# Patient Record
Sex: Male | Born: 1970 | Race: White | Hispanic: No | Marital: Single | Smoking: Never smoker
Health system: Southern US, Community
[De-identification: ages and names within clinical notes are randomized; demographics above are authoritative.]

## PROBLEM LIST (undated history)

## (undated) HISTORY — PX: WRIST SURGERY: SHX841

---

## 1998-05-19 ENCOUNTER — Encounter: Payer: Self-pay | Admitting: Emergency Medicine

## 1998-05-19 ENCOUNTER — Emergency Department (HOSPITAL_COMMUNITY): Admission: EM | Admit: 1998-05-19 | Discharge: 1998-05-19 | Payer: Self-pay | Admitting: Emergency Medicine

## 2015-11-30 ENCOUNTER — Encounter (HOSPITAL_COMMUNITY): Payer: Self-pay | Admitting: Neurology

## 2015-11-30 ENCOUNTER — Emergency Department (HOSPITAL_COMMUNITY)
Admission: EM | Admit: 2015-11-30 | Discharge: 2015-11-30 | Disposition: A | Discharge status: Home / Self Care | Payer: BLUE CROSS/BLUE SHIELD | Attending: Emergency Medicine | Admitting: Emergency Medicine

## 2015-11-30 ENCOUNTER — Emergency Department (HOSPITAL_COMMUNITY): Payer: BLUE CROSS/BLUE SHIELD

## 2015-11-30 DIAGNOSIS — S0993XA Unspecified injury of face, initial encounter: Secondary | ICD-10-CM | POA: Insufficient documentation

## 2015-11-30 DIAGNOSIS — W208XXA Other cause of strike by thrown, projected or falling object, initial encounter: Secondary | ICD-10-CM | POA: Insufficient documentation

## 2015-11-30 DIAGNOSIS — S0990XA Unspecified injury of head, initial encounter: Secondary | ICD-10-CM | POA: Insufficient documentation

## 2015-11-30 DIAGNOSIS — S0031XA Abrasion of nose, initial encounter: Secondary | ICD-10-CM | POA: Diagnosis not present

## 2015-11-30 DIAGNOSIS — Y93H2 Activity, gardening and landscaping: Secondary | ICD-10-CM | POA: Insufficient documentation

## 2015-11-30 DIAGNOSIS — Y9289 Other specified places as the place of occurrence of the external cause: Secondary | ICD-10-CM | POA: Insufficient documentation

## 2015-11-30 DIAGNOSIS — Y998 Other external cause status: Secondary | ICD-10-CM | POA: Diagnosis not present

## 2015-11-30 MED ORDER — CEPHALEXIN 500 MG PO CAPS: 500.0000 mg | ORAL_CAPSULE | Freq: Four times a day (QID) | ORAL | Status: DC

## 2015-11-30 MED ORDER — NAPROXEN 500 MG PO TABS: 500.0000 mg | ORAL_TABLET | Freq: Two times a day (BID) | ORAL | Status: DC

## 2015-11-30 MED ORDER — DIAZEPAM 5 MG PO TABS: 5.0000 mg | ORAL_TABLET | Freq: Two times a day (BID) | ORAL | Status: DC
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
## 2015-11-30 NOTE — Discharge Instructions (Signed)
Mr. Willaim RayasGlennie L Roemer,  Nice meeting you! Please follow-up with your primary care provider. Return to the emergency department if you develop increased pain, swelling, numbness/tingling, loss of consciousness. Feel better soon!  S. Lane HackerNicole Hommer Cunliffe, PA-C
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
## 2019-01-22 ENCOUNTER — Encounter: Payer: Self-pay | Admitting: Family Medicine

## 2019-02-01 ENCOUNTER — Ambulatory Visit (INDEPENDENT_AMBULATORY_CARE_PROVIDER_SITE_OTHER): Payer: BC Managed Care – PPO | Admitting: Family Medicine

## 2019-02-01 ENCOUNTER — Other Ambulatory Visit: Payer: Self-pay

## 2019-02-01 ENCOUNTER — Encounter: Payer: Self-pay | Admitting: Family Medicine

## 2019-02-01 VITALS — BP 118/82 | HR 103 | Temp 97.8°F | Resp 17 | Ht 69.0 in | Wt 211.0 lb

## 2019-02-01 DIAGNOSIS — R42 Dizziness and giddiness: Secondary | ICD-10-CM | POA: Diagnosis not present

## 2019-02-01 DIAGNOSIS — D72829 Elevated white blood cell count, unspecified: Secondary | ICD-10-CM

## 2019-02-01 DIAGNOSIS — H938X2 Other specified disorders of left ear: Secondary | ICD-10-CM

## 2019-02-01 DIAGNOSIS — H9312 Tinnitus, left ear: Secondary | ICD-10-CM

## 2019-02-01 DIAGNOSIS — R739 Hyperglycemia, unspecified: Secondary | ICD-10-CM

## 2019-02-01 NOTE — Progress Notes (Signed)
Subjective:    Patient ID: Victor Shea, male    DOB: [birth year], 48 y.o.   MRN: [id number]  HPI Victor Shea is a 48 y.o. male Presents today for: Chief Complaint  Patient presents with  . New Patient (Initial Visit)    hospital f/u   New patient to me.  Here for follow-up after [facility] [place].  ER records were reviewed from evaluation on July 7 through July 9, and hospital records reviewed after office visit, no additional information.  Summary from hospitalization: Evaluated for unsteady gait/vertigo.  Ultimately discharged on meclizine 10 mg 3 times daily as needed and Medrol Dosepak 4 mg.  Prochlorperazine 10 mg if needed., no restrictions, but was advised if any further dizziness should not drive, specifically should be 24 hours without dizziness before driving.  CT brain noted from July 7.  No acute intracranial hemorrhage, but there was a nodular density along the right vertex.  Small nodular densities up to 4 mm in the anterior parotids bilaterally that were indeterminate.   MRI of brain was obtained July 8.  No restriction diffusion to suggest acute or subacute infarction.  Area of concern at the right vertex on CT appeared to reflect prominent veins.  Mild mucosal thickening in the ethmoids.   Normal chest x-ray. Glucose elevated at 159, normal troponin x3, normal blood pressure at 130/72.  Slight leukocytosis with WBC 14.3, normal hemoglobin, normal platelets.  Neutrophils of 93.8%.   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  Did not eat breakfast taht day - just V8 and coconut water. Drank coffee, but less water than usual.  Hot day.  Changing load in back of trailer,  Felt a little lightheaded. Drank some water. Drove away and when driving up the road about 45-50 miles - looked down and with head movement - delayed effect - felt dizzy. Pulled off road immediately. No syncope, no seizure.  Mouth felt dry.  Tried to stand up  in cab - felt off balance. Vertigo feeling, off balance. vomited 2-3 times. Called EMS.  eval in hospital.   Had some ringing in left ear day prior to sx's. Some left ear soreness. - still there. Sensation of something in left ear.  No discharge.  No Qtips.  Ringing in left ear - 3 episodes prior - resolves in minutes. Ringing has resolved.  Minimal blood in mucus when blew nose day prior to vertigo.  Mom has meniere's disease.   Finished Medrol yesterday. Last used meclizine about 4-5 days ago.   No chest pain.  No focal weakness,  No slurred speech. Feeling much better, but still minimal symptoms with head movement - not quite at 100%. Improved over 1st few days of meds. Walking normally now.   Has been back driving past 4 days. No difficulty with driving.   There are no active problems to display for this patient.  No past medical history on file. Past Surgical History:  Procedure Laterality Date  . WRIST SURGERY     Allergies  Allergen Reactions  . Percocet [Oxycodone-Acetaminophen] Itching   Prior to Admission medications   Not on File   Social History   Socioeconomic History  . Marital status: Single    Spouse name: Not on file  . Number of children: Not on file  . Years of education: Not on file  . Highest education level: Not on file  Occupational History  . Not on file  Social  Needs  . Financial resource strain: Not on file  . Food insecurity    Worry: Not on file    Inability: Not on file  . Transportation needs    Medical: Not on file    Non-medical: Not on file  Tobacco Use  . Smoking status: Never Smoker  . Smokeless tobacco: Never Used  Substance and Sexual Activity  . Alcohol use: Yes  . Drug use: Never  . Sexual activity: Not on file  Lifestyle  . Physical activity    Days per week: Not on file    Minutes per session: Not on file  . Stress: Not on file  Relationships  . Social Herbalist on phone: Not on file    Gets together:  Not on file    Attends religious service: Not on file    Active member of club or organization: Not on file    Attends meetings of clubs or organizations: Not on file    Relationship status: Not on file  . Intimate partner violence    Fear of current or ex partner: Not on file    Emotionally abused: Not on file    Physically abused: Not on file    Forced sexual activity: Not on file  Other Topics Concern  . Not on file  Social History Narrative  . Not on file    Review of Systems Per HPI.     Objective:   Physical Exam Vitals signs reviewed.  Constitutional:      Appearance: He is well-developed.  HENT:     Head: Normocephalic and atraumatic.     Right Ear: Tympanic membrane, ear canal and external ear normal.     Left Ear: Tympanic membrane, ear canal and external ear normal.  Eyes:     Extraocular Movements:     Right eye: Nystagmus (1-2 beats of horizontal nystagmus bilaterally.  Unchanged with supine to sitting.) present. Normal extraocular motion.     Left eye: Nystagmus present. Normal extraocular motion.     Pupils: Pupils are equal, round, and reactive to light.  Neck:     Vascular: No carotid bruit or JVD.  Cardiovascular:     Rate and Rhythm: Normal rate and regular rhythm.     Heart sounds: Normal heart sounds. No murmur.  Pulmonary:     Effort: Pulmonary effort is normal.     Breath sounds: Normal breath sounds. No rales.  Skin:    General: Skin is warm and dry.  Neurological:     General: No focal deficit present.     Mental Status: He is alert. He is disoriented.     Cranial Nerves: No cranial nerve deficit.     Sensory: No sensory deficit.     Motor: No weakness.     Coordination: Coordination normal.     Gait: Gait normal.    Vitals:   02/01/19 1431 02/01/19 1458  BP: (!) 137/92 118/82  Pulse: (!) 103   Resp: 17   Temp: 97.8 F (36.6 C)   SpO2: 96%        Assessment & Plan:   [name] is a 48 y.o. male Vertigo - Plan: POCT  urinalysis dipstick, Ambulatory referral to ENT Ear fullness, left - Plan: Ambulatory referral to ENT Tinnitus of left ear - Plan: Ambulatory referral to ENT  -Suspected peripheral vertigo, now with significant improvement.  Initial concerns on CT brain but follow-up MRI reassuring.  Has had significant improvement  in symptoms, and only very mild symptoms with turning head quickly, not at rest.  -With associated ear fullness, episodic tinnitus differential includes Mnire's, and will refer to ENT  -Meclizine if needed, maintain hydration, driving precautions discussed -  if any symptoms should avoid driving private or commercial vehicle.  Understanding expressed  Hyperglycemia - Plan: Basic metabolic panel, Hemoglobin A1c  -Check A1c, may have slight elevated glucose given recent corticosteroids.  Leukocytosis, unspecified type - Plan: CBC  -Afebrile.  Initial leukocytosis may have been related to vomiting, now may have some leukocytosis with use of corticosteroids.  If still elevated, may need repeat in the next few weeks.  ER/RTC precautions if any acute worsening of symptoms. No orders of the defined types were placed in this encounter.  Patient Instructions   I am glad to hear that your symptoms have improved.  I recommend against any driving if you are experiencing any dizziness or recurrence of the symptoms.  If the symptoms do recur, can restart meclizine as needed.   I will refer you to ear nose and throat to discuss your previous symptoms as well as the sensation in your left ear.  Additionally I will check blood sugar and blood count testing.  As we discussed blood counts may be elevated from the prednisone, so may need to be repeated in the next few weeks.  Blood sugar can also be elevated from prednisone, but three-month blood sugar test should be helpful.  recheck in 2 weeks.   Return to the clinic or go to the nearest emergency room if any of your symptoms worsen or new  symptoms occur.   Vertigo Vertigo is the feeling that you or your surroundings are moving when they are not. This feeling can come and go at any time. Vertigo often goes away on its own. Vertigo can be dangerous if it occurs while you are doing something that could endanger you or others, such as driving or operating machinery. Your health care provider will do tests to determine the cause of your vertigo. Tests will also help your health care provider decide how best to treat your condition. Follow these instructions at home: Eating and drinking      Drink enough fluid to keep your urine pale yellow.  Do not drink alcohol. Activity  Return to your normal activities as told by your health care provider. Ask your health care provider what activities are safe for you.  In the morning, first sit up on the side of the bed. When you feel okay, stand slowly while you hold onto something until you know that your balance is fine.  Move slowly. Avoid sudden body or head movements or certain positions, as told by your health care provider.  If you have trouble walking or keeping your balance, try using a cane for stability. If you feel dizzy or unstable, sit down right away.  Avoid doing any tasks that would cause danger to you or others if vertigo occurs.  Avoid bending down if you feel dizzy. Place items in your home so that they are easy for you to reach without leaning over.  Do not drive or use heavy machinery if you feel dizzy. General instructions  Take over-the-counter and prescription medicines only as told by your health care provider.  Keep all follow-up visits as told by your health care provider. This is important. Contact a health care provider if:  Your medicines do not relieve your vertigo or they make it worse.  You have a fever.  Your condition gets worse or you develop new symptoms.  Your family or friends notice any behavioral changes.  Your nausea or vomiting  gets worse.  You have numbness or a prickling and tingling sensation in part of your body. Get help right away if you:  Have difficulty moving or speaking.  Are always dizzy.  Faint.  Develop severe headaches.  Have weakness in your hands, arms, or legs.  Have changes in your hearing or vision.  Develop a stiff neck.  Develop sensitivity to light. Summary  Vertigo is the feeling that you or your surroundings are moving when they are not.  Your health care provider will do tests to determine the cause of your vertigo.  Follow instructions for home care. You may be told to avoid certain tasks, positions, or movements.  Contact a health care provider if your medicines do not relieve your symptoms, or if you have a fever, nausea, vomiting, or changes in behavior.  Get help right away if you have severe headaches or difficulty speaking, or you develop hearing or vision problems. This information is not intended to replace advice given to you by your health care provider. Make sure you discuss any questions you have with your health care provider. Document Released: 04/13/2005 Document Revised: 05/28/2018 Document Reviewed: 05/28/2018 Elsevier Patient Education  [employer]2020 [employer]     If you have lab work done today you will be contacted with your lab results within the next 2 weeks.  If you have not heard from us then please contact us. The fastest way to get your results is to register for My Chart.   IF you received an x-ray today, you will receive an invoice from [facility]. Please contact [facility] at [phone] with questions or concerns regarding your invoice.   IF you received labwork today, you will receive an invoice from [facility]. Please contact [facility] at [phone] with questions or concerns regarding your invoice.   Our billing staff will not be able to assist you with questions regarding bills from these companies.  You will be  contacted with the lab results as soon as they are available. The fastest way to get your results is to activate your My Chart account. Instructions are located on the last page of this paperwork. If you have not heard from us regarding the results in 2 weeks, please contact this office.      Signed,   Meredith StaggersJeffrey Dannica Bickham, MD Primary Care at [facility].  02/02/19 12:41 PM

## 2019-02-01 NOTE — Patient Instructions (Addendum)
I am glad to hear that your symptoms have improved.  I recommend against any driving if you are experiencing any dizziness or recurrence of the symptoms.  If the symptoms do recur, can restart meclizine as needed.   I will refer you to ear nose and throat to discuss your previous symptoms as well as the sensation in your left ear.  Additionally I will check blood sugar and blood count testing.  As we discussed blood counts may be elevated from the prednisone, so may need to be repeated in the next few weeks.  Blood sugar can also be elevated from prednisone, but three-month blood sugar test should be helpful.  recheck in 2 weeks.   Return to the clinic or go to the nearest emergency room if any of your symptoms worsen or new symptoms occur.   Vertigo Vertigo is the feeling that you or your surroundings are moving when they are not. This feeling can come and go at any time. Vertigo often goes away on its own. Vertigo can be dangerous if it occurs while you are doing something that could endanger you or others, such as driving or operating machinery. Your health care provider will do tests to determine the cause of your vertigo. Tests will also help your health care provider decide how best to treat your condition. Follow these instructions at home: Eating and drinking      Drink enough fluid to keep your urine pale yellow.  Do not drink alcohol. Activity  Return to your normal activities as told by your health care provider. Ask your health care provider what activities are safe for you.  In the morning, first sit up on the side of the bed. When you feel okay, stand slowly while you hold onto something until you know that your balance is fine.  Move slowly. Avoid sudden body or head movements or certain positions, as told by your health care provider.  If you have trouble walking or keeping your balance, try using a cane for stability. If you feel dizzy or unstable, sit down right  away.  Avoid doing any tasks that would cause danger to you or others if vertigo occurs.  Avoid bending down if you feel dizzy. Place items in your home so that they are easy for you to reach without leaning over.  Do not drive or use heavy machinery if you feel dizzy. General instructions  Take over-the-counter and prescription medicines only as told by your health care provider.  Keep all follow-up visits as told by your health care provider. This is important. Contact a health care provider if:  Your medicines do not relieve your vertigo or they make it worse.  You have a fever.  Your condition gets worse or you develop new symptoms.  Your family or friends notice any behavioral changes.  Your nausea or vomiting gets worse.  You have numbness or a prickling and tingling sensation in part of your body. Get help right away if you:  Have difficulty moving or speaking.  Are always dizzy.  Faint.  Develop severe headaches.  Have weakness in your hands, arms, or legs.  Have changes in your hearing or vision.  Develop a stiff neck.  Develop sensitivity to light. Summary  Vertigo is the feeling that you or your surroundings are moving when they are not.  Your health care provider will do tests to determine the cause of your vertigo.  Follow instructions for home care. You may be told to  avoid certain tasks, positions, or movements.  Contact a health care provider if your medicines do not relieve your symptoms, or if you have a fever, nausea, vomiting, or changes in behavior.  Get help right away if you have severe headaches or difficulty speaking, or you develop hearing or vision problems. This information is not intended to replace advice given to you by your health care provider. Make sure you discuss any questions you have with your health care provider. Document Released: 04/13/2005 Document Revised: 05/28/2018 Document Reviewed: 05/28/2018 Elsevier Patient  Education  [employer].     If you have lab work done today you will be contacted with your lab results within the next 2 weeks.  If you have not heard from [place] then please contact us. The fastest way to get your results is to register for My Chart.   IF you received an x-ray today, you will receive an invoice from [facility]. Please contact [facility] at [phone] with questions or concerns regarding your invoice.   IF you received labwork today, you will receive an invoice from [facility]. Please contact [facility] at [phone] with questions or concerns regarding your invoice.   Our billing staff will not be able to assist you with questions regarding bills from these companies.  You will be contacted with the lab results as soon as they are available. The fastest way to get your results is to activate your My Chart account. Instructions are located on the last page of this paperwork. If you have not heard from [place] regarding the results in 2 weeks, please contact this office.

## 2019-02-02 ENCOUNTER — Encounter: Payer: Self-pay | Admitting: Family Medicine

## 2019-02-02 LAB — CBC
Hematocrit: 49.3 % (ref 37.5–51.0)
Hemoglobin: 17 g/dL (ref 13.0–17.7)
MCH: 27.8 pg (ref 26.6–33.0)
MCHC: 34.5 g/dL (ref 31.5–35.7)
MCV: 81 fL (ref 79–97)
Platelets: 300 10*3/uL (ref 150–450)
RBC: 6.12 x10E6/uL — ABNORMAL HIGH (ref 4.14–5.80)
RDW: 13.4 % (ref 11.6–15.4)
WBC: 13.1 10*3/uL — ABNORMAL HIGH (ref 3.4–10.8)

## 2019-02-02 LAB — BASIC METABOLIC PANEL
BUN/Creatinine Ratio: 24 — ABNORMAL HIGH (ref 9–20)
BUN: 26 mg/dL — ABNORMAL HIGH (ref 6–24)
CO2: 22 mmol/L (ref 20–29)
Calcium: 9.7 mg/dL (ref 8.7–10.2)
Chloride: 103 mmol/L (ref 96–106)
Creatinine, Ser: 1.09 mg/dL (ref 0.76–1.27)
GFR calc Af Amer: 94 mL/min/{1.73_m2} (ref >59)
GFR calc non Af Amer: 81 mL/min/{1.73_m2} (ref >59)
Glucose: 99 mg/dL (ref 65–99)
Potassium: 4.5 mmol/L (ref 3.5–5.2)
Sodium: 143 mmol/L (ref 134–144)

## 2019-02-02 LAB — HEMOGLOBIN A1C
Est. average glucose Bld gHb Est-mCnc: 120 mg/dL
Hgb A1c MFr Bld: 5.8 % — ABNORMAL HIGH (ref 4.8–5.6)

## 2019-02-15 ENCOUNTER — Encounter: Payer: Self-pay | Admitting: Family Medicine

## 2019-02-15 ENCOUNTER — Ambulatory Visit: Payer: BC Managed Care – PPO | Admitting: Family Medicine

## 2019-02-15 ENCOUNTER — Other Ambulatory Visit: Payer: Self-pay

## 2019-02-15 ENCOUNTER — Ambulatory Visit (INDEPENDENT_AMBULATORY_CARE_PROVIDER_SITE_OTHER): Payer: BC Managed Care – PPO | Admitting: Family Medicine

## 2019-02-15 VITALS — BP 124/81 | HR 96 | Temp 98.8°F | Resp 16 | Ht 69.0 in | Wt 209.4 lb

## 2019-02-15 DIAGNOSIS — R42 Dizziness and giddiness: Secondary | ICD-10-CM | POA: Diagnosis not present

## 2019-02-15 DIAGNOSIS — R7303 Prediabetes: Secondary | ICD-10-CM | POA: Diagnosis not present

## 2019-02-15 DIAGNOSIS — D72829 Elevated white blood cell count, unspecified: Secondary | ICD-10-CM

## 2019-02-15 NOTE — Progress Notes (Signed)
Subjective:    Patient ID: Victor Shea, male    DOB: 1970-09-20, 48 y.o.   MRN: [id number]  HPI Victor Shea is a 48 y.o. male Presents today for: Chief Complaint  Patient presents with  . Dizziness    follow up 2 weeks - per patient lab work from previous visit 02/01/2019    Vertigo: See office visit July 17.  Suspected peripheral vertigo, hospitalized with evaluation including brain CT with questionable abnormality then reassuring MRI.  He did have significant improvement when I saw him on July 17, and he has been able to return to some driving, only had symptoms with turning his head quickly, not at rest and was not affected his driving.  He did report some ear fullness and episodic tinnitus so refer to ENT as differential included Mnire's.  Still continue meclizine if needed, maintain hydration and driving precautions were discussed.  Has not needed meclizine - has been feeling progressively. Tried one meclizine just to see if side effects/changes- no side effects, no change in feeling. Less feeling with turning quickly. Slight ringing at times only. No pain. Slight fullness. No drainage/no pain.   Hyperglycemia: See prior notes.  Noted in hospital.  A1c slightly elevated at prediabetes level at last visit.  He did receive steroids in the hospital.  Glucose 99 at last visit. Has modified diet - more vegetables, healthier choices, more water, less fried food and sweets.  Wt Readings from Last 3 Encounters:  02/15/19 209 lb 6.4 oz (95 kg)  02/01/19 211 lb (95.7 kg)   Leukocytosis: Also noted to be elevated at hospital, but did have vomiting prior and had been on prednisone, still slightly elevated last visit no fevers no other infectious symptoms. Still no fever, feels well.   There are no active problems to display for this patient.  No past medical history on file. Past Surgical History:  Procedure Laterality Date  . WRIST SURGERY     Allergies  Allergen  Reactions  . Percocet [Oxycodone-Acetaminophen] Itching   Prior to Admission medications   Medication Sig Start Date End Date Taking? Authorizing Provider  MECLIZINE HCL PO Take by mouth as needed.   Yes [provider]   Social History   Socioeconomic History  . Marital status: Single    Spouse name: Not on file  . Number of children: Not on file  . Years of education: Not on file  . Highest education level: Not on file  Occupational History  . Not on file  Social Needs  . Financial resource strain: Not on file  . Food insecurity    Worry: Not on file    Inability: Not on file  . Transportation needs    Medical: Not on file    Non-medical: Not on file  Tobacco Use  . Smoking status: Never Smoker  . Smokeless tobacco: Never Used  Substance and Sexual Activity  . Alcohol use: Yes  . Drug use: Never  . Sexual activity: Not on file  Lifestyle  . Physical activity    Days per week: Not on file    Minutes per session: Not on file  . Stress: Not on file  Relationships  . Social Musicianconnections    Talks on phone: Not on file    Gets together: Not on file    Attends religious service: Not on file    Active member of club or organization: Not on file    Attends meetings of clubs or  organizations: Not on file    Relationship status: Not on file  . Intimate partner violence    Fear of current or ex partner: Not on file    Emotionally abused: Not on file    Physically abused: Not on file    Forced sexual activity: Not on file  Other Topics Concern  . Not on file  Social History Narrative  . Not on file    Review of Systems Per HPi.     Objective:   Physical Exam Vitals signs reviewed.  Constitutional:      Appearance: He is well-developed.  HENT:     Head: Normocephalic and atraumatic.     Right Ear: Tympanic membrane, ear canal and external ear normal.     Left Ear: Tympanic membrane, ear canal and external ear normal.  Eyes:     Pupils: Pupils are  equal, round, and reactive to light.  Neck:     Vascular: No carotid bruit or JVD.  Cardiovascular:     Rate and Rhythm: Normal rate and regular rhythm.     Heart sounds: Normal heart sounds. No murmur.  Pulmonary:     Effort: Pulmonary effort is normal.     Breath sounds: Normal breath sounds. No rales.  Abdominal:     Tenderness: There is no abdominal tenderness.  Skin:    General: Skin is warm and dry.  Neurological:     General: No focal deficit present.     Mental Status: He is alert and oriented to person, place, and time. Mental status is at baseline.     Motor: No weakness.     Coordination: Coordination normal.     Comments: Nonfocal., no nystagmus.     Vitals:   02/15/19 1445  BP: 124/81  Pulse: 96  Resp: 16  Temp: 98.8 F (37.1 C)  TempSrc: Oral  SpO2: 96%  Weight: 209 lb 6.4 oz (95 kg)  Height: 5\' 9"  (1.753 m)        Assessment & Plan:  [name] is a 48 y.o. male Leukocytosis, unspecified type - Plan: CBC with Differential/Platelet  -Afebrile, no signs of infection.  Mild leukocytosis, recheck levels.  Potentially could have been due to prednisone previously.  If persistent elevation will refer to hematology.  Prediabetes  -Commended on improved diet/diet changes.  Is already seen some weight improvement.  Repeat A1c in 3 months.  Vertigo  -Improved, continue hydration, follow-up with ENT, phone number provided.  RTC precautions   No orders of the defined types were placed in this encounter.  Patient Instructions    I'm glad to hear the vertigo has improved. Call ENT for appointment as I have already referred you.   Dr. Leonides Sake. Lucia Gaskins, MD Address: [address], [place], [place] [address] Phone: [phone]  Keep up the good work with diet and recheck A1c (diabetes test) in next 3-6 months.   I will recheck blood counts but if still elevated, can refer you to hematology to discuss if any other testing needed.   13 point  review of systems per patient health survey noted.  Negative other than as indicated above or in HPI.    If you have lab work done today you will be contacted with your lab results within the next 2 weeks.  If you have not heard from [place] then please contact us. The fastest way to get your results is to register for My Chart.   IF you received an  x-ray today, you will receive an invoice from [facility]. Please contact [facility] at [phone] with questions or concerns regarding your invoice.   IF you received labwork today, you will receive an invoice from [facility]. Please contact [facility] at [phone] with questions or concerns regarding your invoice.   Our billing staff will not be able to assist you with questions regarding bills from these companies.  You will be contacted with the lab results as soon as they are available. The fastest way to get your results is to activate your My Chart account. Instructions are located on the last page of this paperwork. If you have not heard from us regarding the results in 2 weeks, please contact this office.     Signed,   Meredith StaggersJeffrey Cobi Aldape, MD Primary Care at [facility].  02/15/19 3:46 PM

## 2019-02-15 NOTE — Patient Instructions (Addendum)
  I'm glad to hear the vertigo has improved. Call ENT for appointment as I have already referred you.   Dr. Leonides Sake. Lucia Gaskins, MD Address: [address] [place], [place] [address] Phone: [phone]  Keep up the good work with diet and recheck A1c (diabetes test) in next 3-6 months.   I will recheck blood counts but if still elevated, can refer you to hematology to discuss if any other testing needed.   13 point review of systems per patient health survey noted.  Negative other than as indicated above or in HPI.    If you have lab work done today you will be contacted with your lab results within the next 2 weeks.  If you have not heard from [place] then please contact us. The fastest way to get your results is to register for My Chart.   IF you received an x-ray today, you will receive an invoice from [facility]. Please contact [facility] at [phone] with questions or concerns regarding your invoice.   IF you received labwork today, you will receive an invoice from [place]. Please contact [facility] at [phone] with questions or concerns regarding your invoice.   Our billing staff will not be able to assist you with questions regarding bills from these companies.  You will be contacted with the lab results as soon as they are available. The fastest way to get your results is to activate your My Chart account. Instructions are located on the last page of this paperwork. If you have not heard from [place] regarding the results in 2 weeks, please contact this office.

## 2019-02-16 LAB — CBC WITH DIFFERENTIAL/PLATELET
Basophils Absolute: 0.1 10*3/uL (ref 0.0–0.2)
Basos: 1 %
EOS (ABSOLUTE): 0.1 10*3/uL (ref 0.0–0.4)
Eos: 1 %
Hematocrit: 45.6 % (ref 37.5–51.0)
Hemoglobin: 15.9 g/dL (ref 13.0–17.7)
Immature Grans (Abs): 0 10*3/uL (ref 0.0–0.1)
Immature Granulocytes: 0 %
Lymphocytes Absolute: 2.4 10*3/uL (ref 0.7–3.1)
Lymphs: 26 %
MCH: 27.8 pg (ref 26.6–33.0)
MCHC: 34.9 g/dL (ref 31.5–35.7)
MCV: 80 fL (ref 79–97)
Monocytes Absolute: 0.6 10*3/uL (ref 0.1–0.9)
Monocytes: 7 %
Neutrophils Absolute: 6 10*3/uL (ref 1.4–7.0)
Neutrophils: 65 %
Platelets: 303 10*3/uL (ref 150–450)
RBC: 5.71 x10E6/uL (ref 4.14–5.80)
RDW: 13.5 % (ref 11.6–15.4)
WBC: 9.2 10*3/uL (ref 3.4–10.8)

## 2019-02-22 DIAGNOSIS — R42 Dizziness and giddiness: Secondary | ICD-10-CM | POA: Diagnosis not present

## 2019-05-11 ENCOUNTER — Encounter (INDEPENDENT_AMBULATORY_CARE_PROVIDER_SITE_OTHER): Payer: Self-pay

## 2019-05-24 ENCOUNTER — Encounter: Payer: Self-pay | Admitting: Family Medicine

## 2019-05-24 ENCOUNTER — Other Ambulatory Visit: Payer: Self-pay

## 2019-05-24 ENCOUNTER — Ambulatory Visit (INDEPENDENT_AMBULATORY_CARE_PROVIDER_SITE_OTHER): Payer: BC Managed Care – PPO | Admitting: Family Medicine

## 2019-05-24 VITALS — BP 122/79 | HR 87 | Temp 98.0°F | Wt 210.4 lb

## 2019-05-24 DIAGNOSIS — Z13220 Encounter for screening for lipoid disorders: Secondary | ICD-10-CM

## 2019-05-24 DIAGNOSIS — R7303 Prediabetes: Secondary | ICD-10-CM | POA: Diagnosis not present

## 2019-05-24 NOTE — Patient Instructions (Addendum)
   150 minutes exercise per week, low intensity and spread out over most days per week. Keep up the good work with diet changes, and take care.   Recheck in 6 months.   If you have lab work done today you will be contacted with your lab results within the next 2 weeks.  If you have not heard from [place] then please contact us. The fastest way to get your results is to register for My Chart.   IF you received an x-ray today, you will receive an invoice from [facility]. Please contact [facility] at [phone] with questions or concerns regarding your invoice.   IF you received labwork today, you will receive an invoice from [place]. Please contact [facility] at [phone] with questions or concerns regarding your invoice.   Our billing staff will not be able to assist you with questions regarding bills from these companies.  You will be contacted with the lab results as soon as they are available. The fastest way to get your results is to activate your My Chart account. Instructions are located on the last page of this paperwork. If you have not heard from [place] regarding the results in 2 weeks, please contact this office.

## 2019-05-24 NOTE — Progress Notes (Signed)
Subjective:  Patient ID: Victor Shea, male    DOB: Apr 26, 1971  Age: 48 y.o. MRN: [id number]  CC:  Chief Complaint  Patient presents with  . Follow-up    3 month f/u on prediabetes    HPI Victor Shea presents for   PreDiabetes:  Has been doing well. Still working, taking precautions. No further dizziness/vertigo.  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  Fast food: rare. Packing food for self.  Soda/sweet tea - ginger ale rarely. Usually water.  Occasional dessert.   Wt Readings from Last 3 Encounters:  05/24/19 210 lb 6.4 oz (95.4 kg)  02/15/19 209 lb 6.4 oz (95 kg)  02/01/19 211 lb (95.7 kg)  had improved diet at 7/31 visit.   Lab Results  Component Value Date   HGBA1C 5.8 (H) 02/01/2019   Lab Results  Component Value Date   CREATININE 1.09 02/01/2019    History There are no active problems to display for this patient.  No past medical history on file. Past Surgical History:  Procedure Laterality Date  . WRIST SURGERY     Allergies  Allergen Reactions  . Percocet [Oxycodone-Acetaminophen] Itching   Prior to Admission medications   Not on File   Social History   Socioeconomic History  . Marital status: Single    Spouse name: Not on file  . Number of children: Not on file  . Years of education: Not on file  . Highest education level: Not on file  Occupational History  . Not on file  Social Needs  . Financial resource strain: Not on file  . Food insecurity    Worry: Not on file    Inability: Not on file  . Transportation needs    Medical: Not on file    Non-medical: Not on file  Tobacco Use  . Smoking status: Never Smoker  . Smokeless tobacco: Never Used  Substance and Sexual Activity  . Alcohol use: Yes  . Drug use: Never  . Sexual activity: Not on file  Lifestyle  . Physical activity    Days per week: Not on file    Minutes per session: Not on file  . Stress: Not on file   Relationships  . Social Herbalist on phone: Not on file    Gets together: Not on file    Attends religious service: Not on file    Active member of club or organization: Not on file    Attends meetings of clubs or organizations: Not on file    Relationship status: Not on file  . Intimate partner violence    Fear of current or ex partner: Not on file    Emotionally abused: Not on file    Physically abused: Not on file    Forced sexual activity: Not on file  Other Topics Concern  . Not on file  Social History Narrative  . Not on file    Review of Systems  Constitutional: Negative for fatigue and unexpected weight change.  Eyes: Negative for visual disturbance.  Endocrine: Negative for polydipsia, polyphagia and polyuria.  Genitourinary: Negative for difficulty urinating and frequency.     Objective:   Vitals:   05/24/19 1341  BP: 122/79  Pulse: 87  Temp: 98 F (36.7 C)  TempSrc: Oral  SpO2: 97%  Weight: 210 lb 6.4 oz (95.4 kg)     Physical Exam Vitals signs reviewed.  Constitutional:  Appearance: He is well-developed.  HENT:     Head: Normocephalic and atraumatic.  Eyes:     Pupils: Pupils are equal, round, and reactive to light.  Neck:     Vascular: No carotid bruit or JVD.  Cardiovascular:     Rate and Rhythm: Normal rate and regular rhythm.     Heart sounds: Normal heart sounds. No murmur.  Pulmonary:     Effort: Pulmonary effort is normal.     Breath sounds: Normal breath sounds. No rales.  Skin:    General: Skin is warm and dry.  Neurological:     Mental Status: He is alert and oriented to person, place, and time.        Assessment & Plan:  Victor Shea is a 48 y.o. male . Prediabetes - Plan: Hemoglobin A1c  Screening for hyperlipidemia - Plan: Lipid Panel  Commended on diet changes.  Check A1c.  Increase exercise, low intensity exercise discussed for weight loss.  Check A1c, lipids, repeat eval in 6 months.  Flu vaccine  recommended but declined.  No orders of the defined types were placed in this encounter.  Patient Instructions       If you have lab work done today you will be contacted with your lab results within the next 2 weeks.  If you have not heard from [place] then please contact us. The fastest way to get your results is to register for My Chart.   IF you received an x-ray today, you will receive an invoice from [facility]. Please contact [facility] at [phone] with questions or concerns regarding your invoice.   IF you received labwork today, you will receive an invoice from [place]. Please contact [facility] at [phone] with questions or concerns regarding your invoice.   Our billing staff will not be able to assist you with questions regarding bills from these companies.  You will be contacted with the lab results as soon as they are available. The fastest way to get your results is to activate your My Chart account. Instructions are located on the last page of this paperwork. If you have not heard from [place] regarding the results in 2 weeks, please contact this office.          Signed, Meredith Staggers, MD [facility]

## 2019-05-25 LAB — LIPID PANEL
Chol/HDL Ratio: 6.2 ratio — ABNORMAL HIGH (ref 0.0–5.0)
Cholesterol, Total: 229 mg/dL — ABNORMAL HIGH (ref 100–199)
HDL: 37 mg/dL — ABNORMAL LOW (ref >39)
LDL Chol Calc (NIH): 167 mg/dL — ABNORMAL HIGH (ref 0–99)
Triglycerides: 138 mg/dL (ref 0–149)
VLDL Cholesterol Cal: 25 mg/dL (ref 5–40)

## 2019-05-25 LAB — HEMOGLOBIN A1C
Est. average glucose Bld gHb Est-mCnc: 114 mg/dL
Hgb A1c MFr Bld: 5.6 % (ref 4.8–5.6)

## 2019-06-03 ENCOUNTER — Encounter: Payer: Self-pay | Admitting: Radiology

## 2019-11-22 ENCOUNTER — Ambulatory Visit: Payer: BC Managed Care – PPO | Admitting: Family Medicine

## 2023-09-20 ENCOUNTER — Telehealth: Payer: Self-pay | Admitting: Family Medicine

## 2023-09-20 NOTE — Telephone Encounter (Addendum)
 On 09/13/23 Victor Shea showed with paper work and wanting to see Dr Neva Seat. He is the friend of this family. He expressed that this family has lost their son recently in an auto accident and is only trying to help them out. At the time I expressed to him he has no legal rights. And the patint should contact both legal and medical records. Since then the wife calls yesterday with the same story 09/19/23 about the friend and her husband and what exactly the husband needs.   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  Sorry for the delay in communication I thought I put in somewhere but I can't find it now.   The wife stated they have all the MRIs and everything else they need just not this letter from Dr Neva Seat.   Victor Shea his spouse. She can be reached at home phone [phone].
